# Patient Record
Sex: Female | Born: 1960 | Race: Black or African American | Hispanic: No | Marital: Single | Smoking: Current every day smoker
Health system: Southern US, Community
[De-identification: ages and names within clinical notes are randomized; demographics above are authoritative.]

---

## 2001-04-16 ENCOUNTER — Emergency Department (HOSPITAL_COMMUNITY): Admission: EM | Admit: 2001-04-16 | Discharge: 2001-04-16 | Payer: Self-pay | Admitting: Emergency Medicine

## 2001-04-16 ENCOUNTER — Encounter: Payer: Self-pay | Admitting: Emergency Medicine

## 2001-04-22 ENCOUNTER — Encounter: Payer: Self-pay | Admitting: Emergency Medicine

## 2001-04-22 ENCOUNTER — Emergency Department (HOSPITAL_COMMUNITY): Admission: EM | Admit: 2001-04-22 | Discharge: 2001-04-23 | Payer: Self-pay | Admitting: Emergency Medicine

## 2001-10-17 ENCOUNTER — Emergency Department (HOSPITAL_COMMUNITY): Admission: EM | Admit: 2001-10-17 | Discharge: 2001-10-17 | Payer: Self-pay | Admitting: Emergency Medicine

## 2001-10-21 ENCOUNTER — Emergency Department (HOSPITAL_COMMUNITY): Admission: EM | Admit: 2001-10-21 | Discharge: 2001-10-21 | Payer: Self-pay | Admitting: Emergency Medicine

## 2001-10-24 ENCOUNTER — Emergency Department (HOSPITAL_COMMUNITY): Admission: EM | Admit: 2001-10-24 | Discharge: 2001-10-24 | Payer: Self-pay

## 2016-05-30 ENCOUNTER — Encounter (HOSPITAL_COMMUNITY): Payer: Self-pay

## 2016-05-30 ENCOUNTER — Emergency Department (HOSPITAL_COMMUNITY)
Admission: EM | Admit: 2016-05-30 | Discharge: 2016-05-30 | Disposition: A | Discharge status: Home / Self Care | Payer: Self-pay | Attending: Emergency Medicine | Admitting: Emergency Medicine

## 2016-05-30 ENCOUNTER — Emergency Department (HOSPITAL_COMMUNITY): Payer: Self-pay

## 2016-05-30 DIAGNOSIS — R51 Headache: Secondary | ICD-10-CM | POA: Insufficient documentation

## 2016-05-30 DIAGNOSIS — F17200 Nicotine dependence, unspecified, uncomplicated: Secondary | ICD-10-CM | POA: Insufficient documentation

## 2016-05-30 DIAGNOSIS — R519 Headache, unspecified: Secondary | ICD-10-CM

## 2016-05-30 LAB — CBC WITH DIFFERENTIAL/PLATELET
Basophils Absolute: 0 10*3/uL (ref 0.0–0.1)
Basophils Relative: 0 %
Eosinophils Absolute: 0.1 10*3/uL (ref 0.0–0.7)
Eosinophils Relative: 1 %
HCT: 44.4 % (ref 36.0–46.0)
Hemoglobin: 15.3 g/dL — ABNORMAL HIGH (ref 12.0–15.0)
Lymphocytes Relative: 28 %
Lymphs Abs: 2.8 10*3/uL (ref 0.7–4.0)
MCH: 30.3 pg (ref 26.0–34.0)
MCHC: 34.5 g/dL (ref 30.0–36.0)
MCV: 87.9 fL (ref 78.0–100.0)
Monocytes Absolute: 0.7 10*3/uL (ref 0.1–1.0)
Monocytes Relative: 7 %
Neutro Abs: 6.3 10*3/uL (ref 1.7–7.7)
Neutrophils Relative %: 64 %
Platelets: 201 10*3/uL (ref 150–400)
RBC: 5.05 MIL/uL (ref 3.87–5.11)
RDW: 14.2 % (ref 11.5–15.5)
WBC: 10 10*3/uL (ref 4.0–10.5)

## 2016-05-30 LAB — BASIC METABOLIC PANEL
Anion gap: 7 (ref 5–15)
BUN: 8 mg/dL (ref 6–20)
CO2: 25 mmol/L (ref 22–32)
Calcium: 8.5 mg/dL — ABNORMAL LOW (ref 8.9–10.3)
Chloride: 107 mmol/L (ref 101–111)
Creatinine, Ser: 0.7 mg/dL (ref 0.44–1.00)
GFR calc Af Amer: >60 mL/min (ref >60)
GFR calc non Af Amer: >60 mL/min (ref >60)
Glucose, Bld: 113 mg/dL — ABNORMAL HIGH (ref 65–99)
Potassium: 3 mmol/L — ABNORMAL LOW (ref 3.5–5.1)
Sodium: 139 mmol/L (ref 135–145)

## 2016-05-30 MED ORDER — SODIUM CHLORIDE 0.9 % IV BOLUS (SEPSIS)
500.0000 mL | Freq: Once | INTRAVENOUS | Status: AC
  Administered 2016-05-30: 500 mL via INTRAVENOUS

## 2016-05-30 MED ORDER — PROCHLORPERAZINE EDISYLATE 5 MG/ML IJ SOLN
10.0000 mg | Freq: Once | INTRAMUSCULAR | Status: AC
  Administered 2016-05-30: 10 mg via INTRAVENOUS
  Filled 2016-05-30: qty 2

## 2016-05-30 NOTE — ED Notes (Signed)
Family at the bedside, state pt has been eating a lot of pork, feels like BP is been elevated

## 2016-05-30 NOTE — ED Notes (Signed)
LAb at the bedside

## 2016-05-30 NOTE — ED Notes (Signed)
Patient given discharge instruction, verbalized understand. IV removed, band aid applied. Patient ambulatory out of the department.  

## 2016-05-30 NOTE — ED Notes (Signed)
Patient ambulatory to restroom with family

## 2016-05-30 NOTE — ED Provider Notes (Signed)
CSN: [id number]     Arrival date & time 05/30/16  0044 History   First MD Initiated Contact with Patient 05/30/16 0122     Chief Complaint  Patient presents with  . Headache      Patient is a 55 y.o. female presenting with headaches. The history is provided by the patient.  Headache Patient presents with a frontal headache. States began earlier today. Began both suddenly and gradually. It is dull. No floaters but states she does have slightly blurred vision. States she does take less. She's had nausea without vomiting. States there is no fever. She does not tend to get headaches. States she has had urinary frequency. States that his been going on for about a week. No abdominal pain. No chest pain. No sick contacts.  History reviewed. No pertinent past medical history. History reviewed. No pertinent past surgical history. No family history on file. Social History  Substance Use Topics  . Smoking status: Current Every Day Smoker  . Smokeless tobacco: None  . Alcohol Use: No   OB History    No data available     Review of Systems  Neurological: Positive for headaches.      Allergies  Review of patient's allergies indicates no known allergies.  Home Medications   Prior to Admission medications   Not on File   BP 188/84 mmHg  Pulse 82  Temp(Src) 97.7 F (36.5 C) (Oral)  Resp 18  Ht  (1.575 m)  Wt 156 lb (70.761 kg)  BMI 28.53 kg/m2  SpO2 94% Physical Exam  Constitutional: She appears well-developed.  HENT:  Head: Atraumatic.  Mild tenderness over forehead.  Eyes: EOM are normal.  Neck: Neck supple.  Cardiovascular: Normal rate.   Pulmonary/Chest: Effort normal.  Abdominal: Soft.  Musculoskeletal: She exhibits no edema.  Neurological: She is alert.  Skin: Skin is warm.    ED Course  Procedures (including critical care time) Labs Review Labs Reviewed  BASIC METABOLIC PANEL - Abnormal; Notable for the following:    Potassium 3.0 (*)    Glucose, Bld  113 (*)    Calcium 8.5 (*)    All other components within normal limits  CBC WITH DIFFERENTIAL/PLATELET - Abnormal; Notable for the following:    Hemoglobin 15.3 (*)    All other components within normal limits  URINALYSIS, ROUTINE W REFLEX MICROSCOPIC (NOT AT [facility])    Imaging Review Ct Head Wo Contrast  05/30/2016  CLINICAL DATA:  Acute onset of headache, dizziness and generalized weakness. Initial encounter. EXAM: CT HEAD WITHOUT CONTRAST TECHNIQUE: Contiguous axial images were obtained from the base of the skull through the vertex without intravenous contrast. COMPARISON:  None. FINDINGS: There is no evidence of acute infarction, mass lesion, or intra- or extra-axial hemorrhage on CT. The posterior fossa, including the cerebellum, brainstem and fourth ventricle, is within normal limits. The third and lateral ventricles, and basal ganglia are unremarkable in appearance. The cerebral hemispheres are symmetric in appearance, with normal gray-white differentiation. No mass effect or midline shift is seen. There is no evidence of fracture; visualized osseous structures are unremarkable in appearance. The visualized portions of the orbits are within normal limits. The paranasal sinuses and mastoid air cells are well-aerated. No significant soft tissue abnormalities are seen. IMPRESSION: Unremarkable noncontrast CT of the head. Electronically Signed   By: Roanna Raider M.D.   On: 05/30/2016 02:46   I have personally reviewed and evaluated these images and lab results as part of my medical  decision-making.   EKG Interpretation None      MDM   Final diagnoses:  Acute nonintractable headache, unspecified headache type    Patient with headache. Feels better after treatment. CT reassuring. Will discharge home. Doubt infection.    Benjiman CoreNathan Kinslei Labine, MD 05/30/16 [phone]

## 2016-05-30 NOTE — ED Notes (Signed)
Pt reports headache, dizziness, weakness all day, was at work tonight when she felt worse and called ems.

## 2016-05-30 NOTE — Discharge Instructions (Signed)

## 2017-05-10 IMAGING — CT CT HEAD W/O CM
4 series · 16 of 47 positions shown, 18 images · non-contrast
Comparison: None.

CLINICAL DATA: Acute onset of headache, dizziness and generalized
weakness. Initial encounter.

EXAM:
CT HEAD WITHOUT CONTRAST
TECHNIQUE: Contiguous axial images were obtained from the base of the skull
through the vertex without intravenous contrast.

[Series 2: head w/o · axial · non-contrast · 0.39mm/px · z∈[+84,+196]mm · 8 of 38 slices shown, 10 images]
[im 5/38  brain]
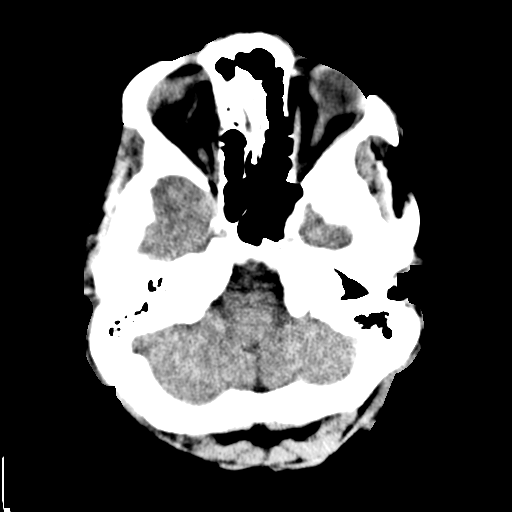
[im 5/38  bone]
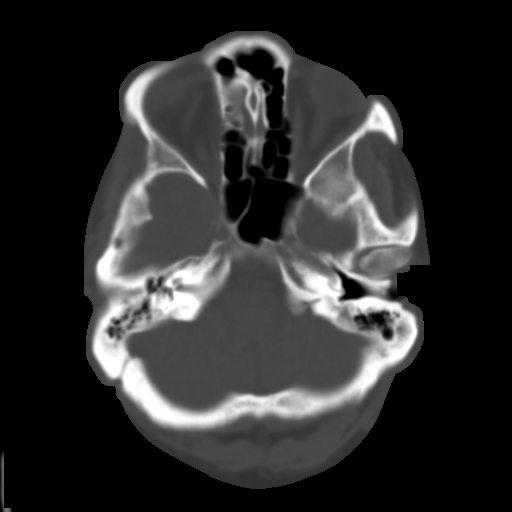
[im 9/38  brain]
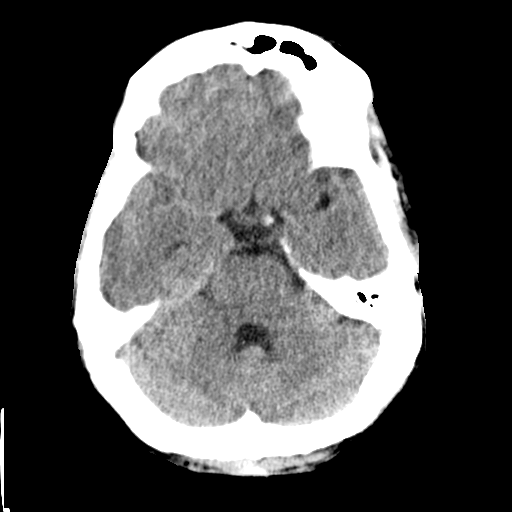
[im 13/38  brain]
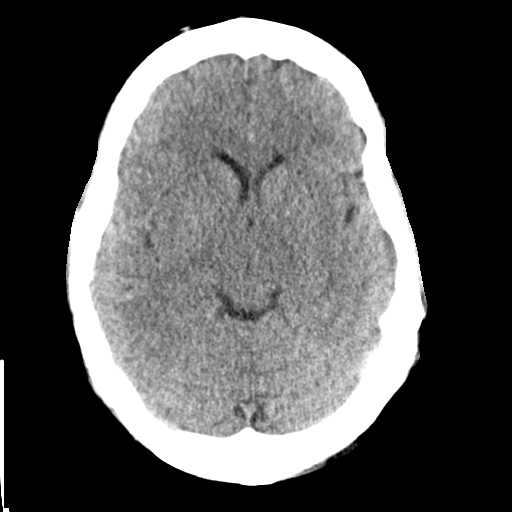
[im 17/38  brain]
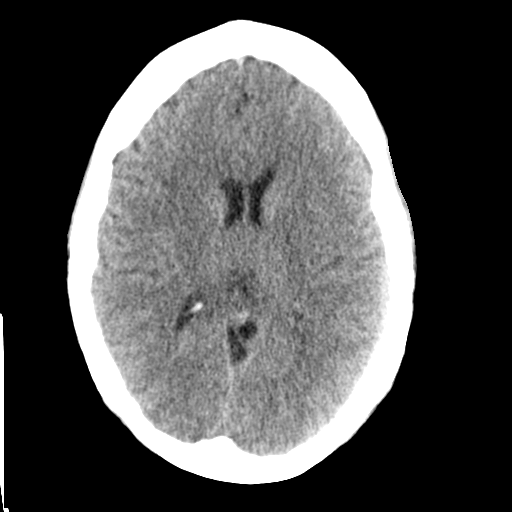
[im 21/38  brain]
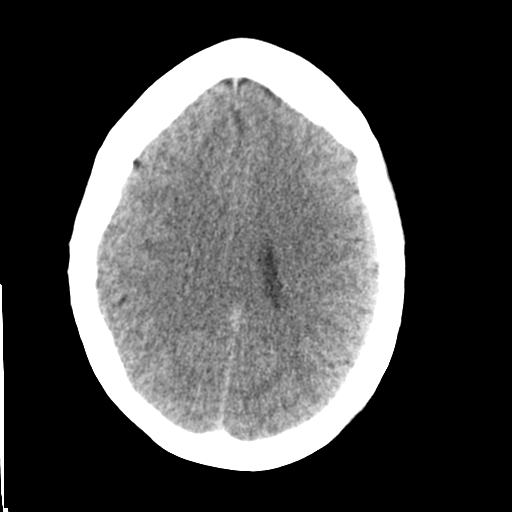
[im 21/38  bone]
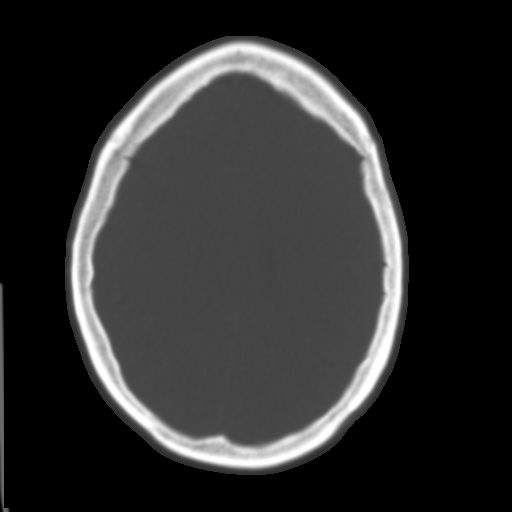
[im 25/38  brain]
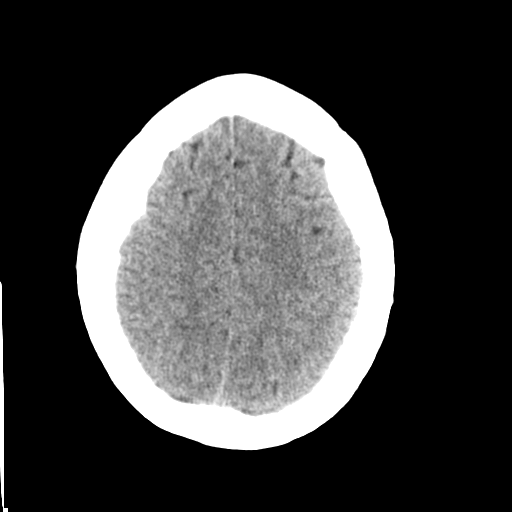
[im 29/38  brain]
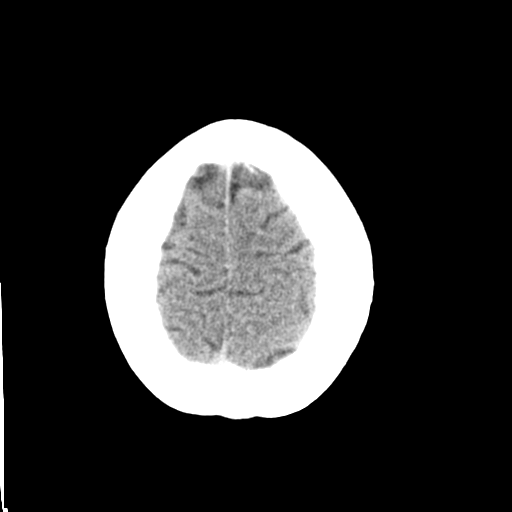
[im 33/38  brain]
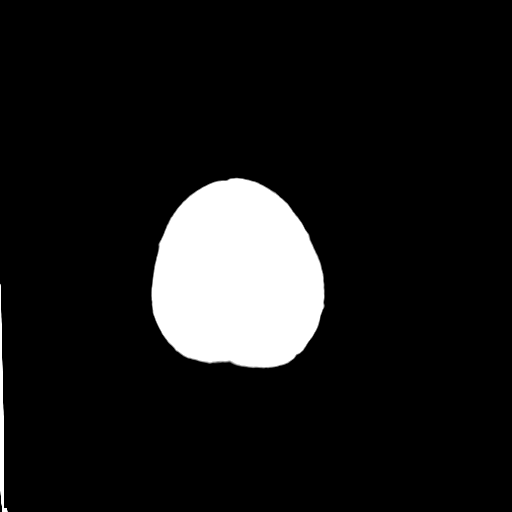

[Series 3: head bone · axial · 0.39mm/px · z∈[+82,+98]mm · 2 of 75 slices shown]
[im 8/75  bone]
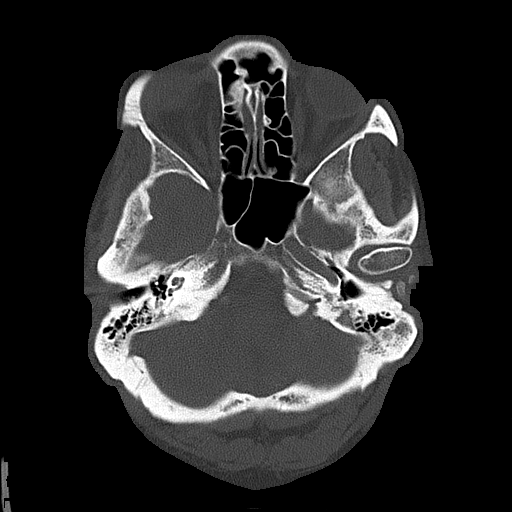
[im 16/75  bone]
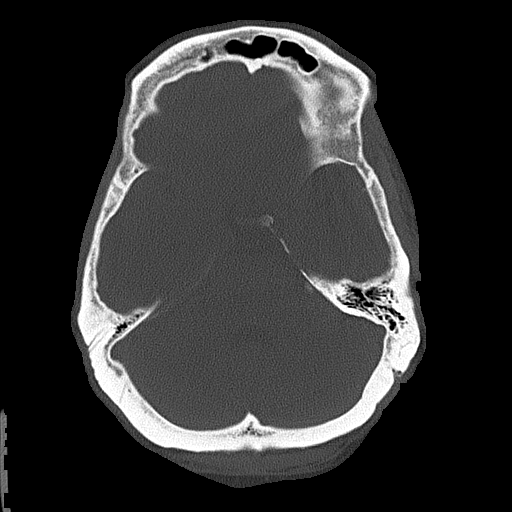

[Series 4: coronal · coronal · 0.31mm/px · 3 of 65 slices shown]
[im 22/65  brain]
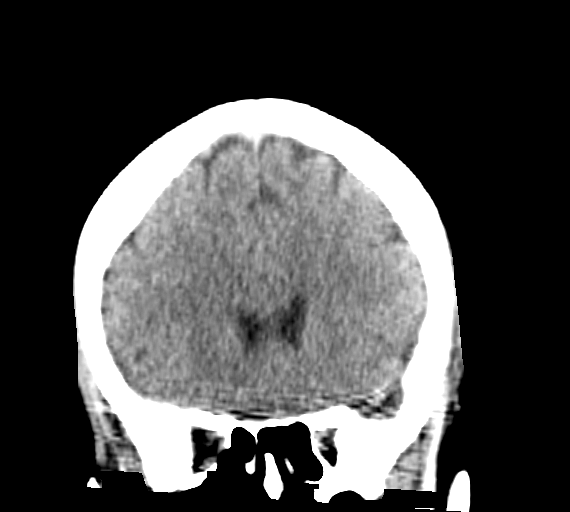
[im 29/65  brain]
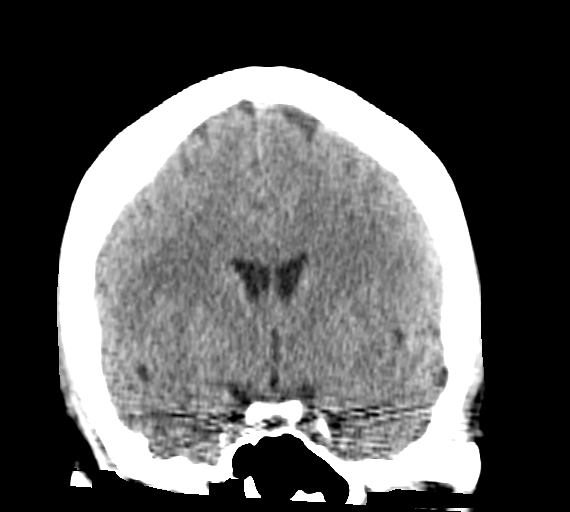
[im 36/65  brain]
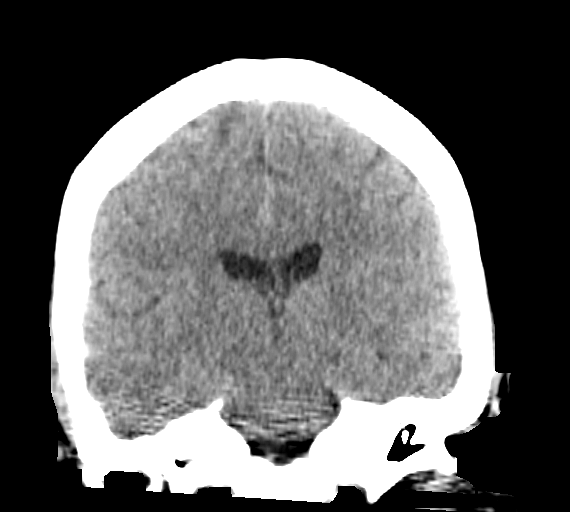

[Series 5: sagittal · sagittal · 0.30mm/px · 3 of 47 slices shown]
[im 16/47  brain]
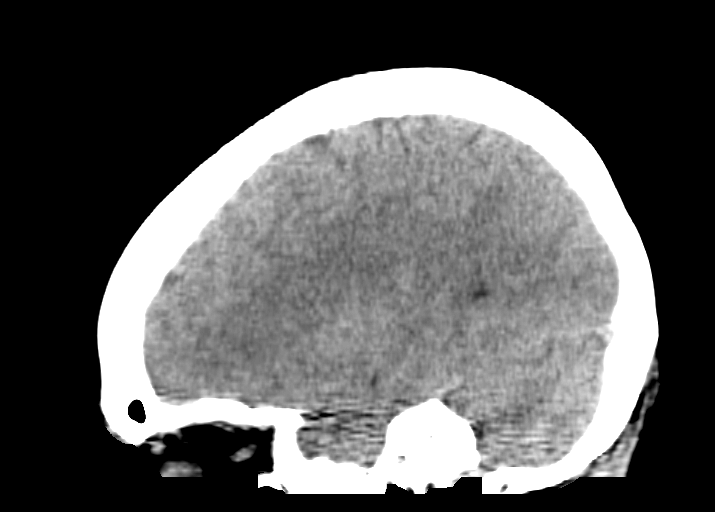
[im 24/47  brain]
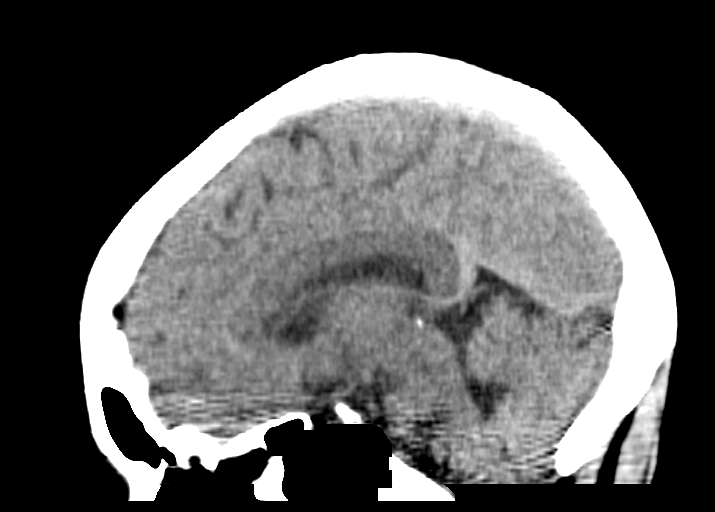
[im 31/47  brain]
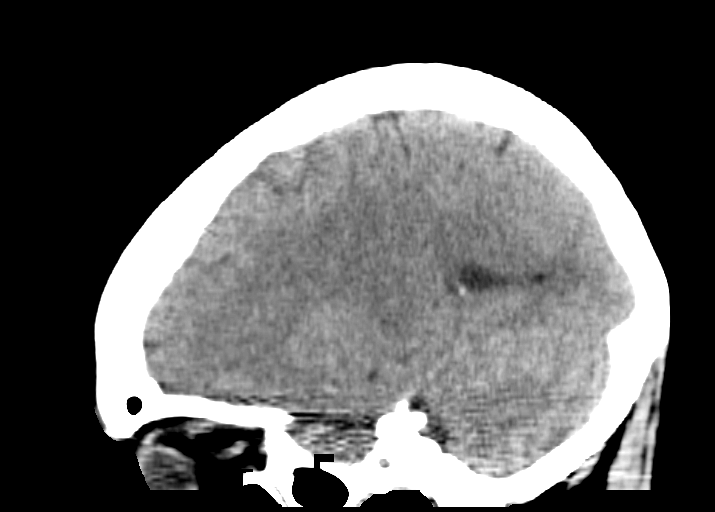

[16 of 47 positions shown; findings below may reference images not displayed]

FINDINGS: There is no evidence of acute infarction, mass lesion, or intra- or
extra-axial hemorrhage on CT.

The posterior fossa, including the cerebellum, brainstem and fourth
ventricle, is within normal limits. The third and lateral
ventricles, and basal ganglia are unremarkable in appearance. The
cerebral hemispheres are symmetric in appearance, with normal
gray-white differentiation. No mass effect or midline shift is seen.

There is no evidence of fracture; visualized osseous structures are
unremarkable in appearance. The visualized portions of the orbits
are within normal limits. The paranasal sinuses and mastoid air
cells are well-aerated. No significant soft tissue abnormalities are
seen.
IMPRESSION: Unremarkable noncontrast CT of the head.

## 2023-09-09 ENCOUNTER — Other Ambulatory Visit (HOSPITAL_COMMUNITY): Payer: Self-pay
